# Patient Record
Sex: Male | Born: 1975 | Race: White | Hispanic: No | Marital: Married | Smoking: Former smoker
Health system: Southern US, Community
[De-identification: ages and names within clinical notes are randomized; demographics above are authoritative.]

## PROBLEM LIST (undated history)

## (undated) DIAGNOSIS — K219 Gastro-esophageal reflux disease without esophagitis: Secondary | ICD-10-CM

## (undated) HISTORY — PX: CHOLECYSTECTOMY: SHX55

## (undated) HISTORY — PX: WRIST SURGERY: SHX841

---

## 2009-08-05 ENCOUNTER — Ambulatory Visit: Payer: Self-pay | Admitting: Family Medicine

## 2009-08-05 DIAGNOSIS — J069 Acute upper respiratory infection, unspecified: Secondary | ICD-10-CM | POA: Insufficient documentation

## 2009-08-06 ENCOUNTER — Encounter: Payer: Self-pay | Admitting: Family Medicine

## 2010-02-25 NOTE — Letter (Signed)
Summary: Out of Work  [facility]  [address]   [place], [place] [address]   Phone: [phone]  Fax: [phone]    August 05, 2009   Employee:  [employer]    To Whom It May Concern:   For Medical reasons, please excuse the above named employee from work today and tomorrow.    If you need additional information, please feel free to contact our office.         Sincerely,    Donna Christen MD

## 2010-02-25 NOTE — Assessment & Plan Note (Signed)
Summary: Cough/runny nose- yellowish green x 4 dys rm 2   Vital Signs:  Patient Profile:   35 Years Old Male CC:      Cold & URI symptoms Height:     74 inches Weight:      210 pounds O2 Sat:      98 % O2 treatment:    Room Air Temp:     97.4 degrees F oral Pulse rate:   53 / minute Pulse rhythm:   regular Resp:     16 per minute BP sitting:   122 / 77  (right arm) Cuff size:   regular  Vitals Entered By: Areta Haber CMA (August 05, 2009 9:47 AM)                  Current Allergies: No known allergies History of Present Illness Chief Complaint: Cold & URI symptoms History of Present Illness:  Subjective: Patient complains of URI symptoms that started 4 days ago with a sore throat followed by nasal congestion Sore throat persists but has improved + cough worse at night No pleuritic pain No wheezing + post-nasal drainage ? sinus pain/pressure No itchy/red eyes No earache No hemoptysis No SOB No fever/chills No nausea No vomiting No abdominal pain No diarrhea No skin rashes + fatigue No myalgias + headache Used OTC meds without relief   Current Problems: URI (ICD-465.9)   Current Meds ACYCLOVIR 800 MG TABS (ACYCLOVIR) 1 tab by mouth once daily VICKS NYQUIL MULTI-SYMPTOM 15-6.25-325 MG CAPS (DM-DOXYLAMINE-ACETAMINOPHEN) as directed DAYQUIL MULTI-SYMPTOM 30-325-10 MG/15ML LIQD (PSEUDOEPHEDRINE-APAP-DM) as directed AFRIN NASAL SPRAY 0.05 % SOLN (OXYMETAZOLINE HCL) as directed BENZONATATE 200 MG CAPS (BENZONATATE) One by mouth hs as needed cough AZITHROMYCIN 250 MG TABS (AZITHROMYCIN) Two tabs by mouth on day 1, then 1 tab daily on days 2 through 5  (Rx void after 08/12/09)  REVIEW OF SYSTEMS Constitutional Symptoms      Denies fever, chills, night sweats, weight loss, weight gain, and fatigue.  Eyes       Denies change in vision, eye pain, eye discharge, glasses, contact lenses, and eye surgery. Ear/Nose/Throat/Mouth       Complains of frequent runny  nose, sinus problems, sore throat, and hoarseness.      Denies hearing loss/aids, change in hearing, ear pain, ear discharge, dizziness, frequent nose bleeds, and tooth pain or bleeding.      Comments: yellowish greenx 4 dys  Respiratory       Complains of productive cough.      Denies dry cough, wheezing, shortness of breath, asthma, bronchitis, and emphysema/COPD.  Cardiovascular       Denies murmurs, chest pain, and tires easily with exhertion.    Gastrointestinal       Denies stomach pain, nausea/vomiting, diarrhea, constipation, blood in bowel movements, and indigestion. Genitourniary       Denies painful urination, kidney stones, and loss of urinary control. Neurological       Complains of headaches.      Denies paralysis, seizures, and fainting/blackouts. Musculoskeletal       Denies muscle pain, joint pain, joint stiffness, decreased range of motion, redness, swelling, muscle weakness, and gout.  Skin       Denies bruising, unusual mles/lumps or sores, and hair/skin or nail changes.  Psych       Denies mood changes, temper/anger issues, anxiety/stress, speech problems, depression, and sleep problems. Other Comments: yellowish green x 4 dys. Pt has not seen PCP for this.   Past History:  Past Medical History: HSV 1  Past Surgical History: Denies surgical history  Social History: Married Never Smoked Alcohol use-no Drug use-no Regular exercise-yes Smoking Status:  never Drug Use:  no Does Patient Exercise:  yes   Objective:  Appearance:  Patient appears healthy, stated age, and in no acute distress  Eyes:  Pupils are equal, round, and reactive to light and accomdation.  Extraocular movement is intact.  Conjunctivae are not inflamed.  Ears:  Canals normal.  Tympanic membranes normal.   Nose:  Normal septum.  Normal turbinates, mildly congested.    No sinus tenderness present.  Pharynx:  Mildly erythematous Neck:  Supple.  No adenopathy is present.  No thyromegaly is  present  Lungs:  Clear to auscultation.  Breath sounds are equal.  Heart:  Regular rate and rhythm without murmurs, rubs, or gallops.  Abdomen:  Nontender without masses or hepatosplenomegaly.  Bowel sounds are present.  No CVA or flank tenderness.  Rapid strep test negative  Assessment New Problems: URI (ICD-465.9)  VIRAL URI  Plan New Medications/Changes: AZITHROMYCIN 250 MG TABS (AZITHROMYCIN) Two tabs by mouth on day 1, then 1 tab daily on days 2 through 5  (Rx void after 08/12/09)  #6 tabs x 0, 08/05/2009, Donna Christen MD BENZONATATE 200 MG CAPS (BENZONATATE) One by mouth hs as needed cough  #12 x 0, 08/05/2009, Donna Christen MD  New Orders: New Patient Level III [[address]] Rapid Strep [87880] T-Culture, Throat [[id number]] Planning Comments:   Treat symptomatically for now:  expectorant/decongestant, cough suppressant at bedtime.  Add Z-pack if not improving about 5 days. Throat culture pending. Follow-up with PCP if not improving.   The patient and/or caregiver has been counseled thoroughly with regard to medications prescribed including dosage, schedule, interactions, rationale for use, and possible side effects and they verbalize understanding.  Diagnoses and expected course of recovery discussed and will return if not improved as expected or if the condition worsens. Patient and/or caregiver verbalized understanding.  Prescriptions: AZITHROMYCIN 250 MG TABS (AZITHROMYCIN) Two tabs by mouth on day 1, then 1 tab daily on days 2 through 5  (Rx void after 08/12/09)  #6 tabs x 0   Entered and Authorized by:   Donna Christen MD   Signed by:   Donna Christen MD on 08/05/2009   Method used:   Print then Give to Patient   RxID:   [id number] BENZONATATE 200 MG CAPS (BENZONATATE) One by mouth hs as needed cough  #12 x 0   Entered and Authorized by:   Donna Christen MD   Signed by:   Donna Christen MD on 08/05/2009   Method used:   Print then Give to Patient   RxID:    [id number]   Patient Instructions: 1)  May use Mucinex D (guaifenesin with decongestant) twice daily for congestion. 2)  Increase fluid intake, rest. 3)  May use Afrin nasal spray (or generic oxymetazoline) twice daily for about 5 days.  Also recommend using saline nasal spray several times daily and/or saline nasal irrigation. 4)  Add Z-pack if not improving 5 to 7 days. 5)  Followup with family doctor if not improving 7 to 10 days  Orders Added: 1)  New Patient Level III [[address]] 2)  Rapid Strep [41324] 3)  T-Culture, Throat [[id number]]  Laboratory Results  Date/Time Received: August 05, 2009 10:27 AM  Date/Time Reported: August 05, 2009 10:27 AM   Other Tests  Rapid Strep: negative  Kit Test Internal QC: Negative   (Normal  Range: Negative)

## 2010-03-02 ENCOUNTER — Emergency Department (HOSPITAL_BASED_OUTPATIENT_CLINIC_OR_DEPARTMENT_OTHER)
Admission: EM | Admit: 2010-03-02 | Discharge: 2010-03-02 | Disposition: A | Discharge status: Home / Self Care | Payer: BC Managed Care – HMO | Attending: Emergency Medicine | Admitting: Emergency Medicine

## 2010-03-02 DIAGNOSIS — J029 Acute pharyngitis, unspecified: Secondary | ICD-10-CM | POA: Insufficient documentation

## 2010-07-11 ENCOUNTER — Inpatient Hospital Stay (INDEPENDENT_AMBULATORY_CARE_PROVIDER_SITE_OTHER)
Admission: RE | Admit: 2010-07-11 | Discharge: 2010-07-11 | Disposition: A | Discharge status: Home / Self Care | Payer: BC Managed Care – HMO | Source: Ambulatory Visit | Attending: Family Medicine | Admitting: Family Medicine

## 2010-07-11 ENCOUNTER — Encounter: Payer: Self-pay | Admitting: Family Medicine

## 2010-07-11 DIAGNOSIS — H6690 Otitis media, unspecified, unspecified ear: Secondary | ICD-10-CM

## 2010-07-11 DIAGNOSIS — H6120 Impacted cerumen, unspecified ear: Secondary | ICD-10-CM

## 2010-12-29 NOTE — Progress Notes (Signed)
Summary: SINUS & EAR PAIN (rm 5)   Vital Signs:  Patient Profile:   35 Years Old Male CC:      left ear pain and congestion Height:     74 inches Weight:      205 pounds O2 Sat:      97 % O2 treatment:    Room Air Temp:     98.7 degrees F oral Pulse rate:   88 / minute Resp:     14 per minute BP sitting:   130 / 80  (left arm) Cuff size:   large  Vitals Entered By: Lajean Saver RN (July 11, 2010 7:36 PM)                  Updated Prior Medication List: No Medications Current Allergies: No known allergies History of Present Illness Chief Complaint: left ear pain and congestion History of Present Illness:  Subjective:  Patient complains of 2 week history of sinus congestion, with initial cold-like symptoms resolved.  Three days ago he developed sensation of right ear clogged without pain.  Has persistent sinus congestion.  No fevers, chills, and sweats.  No cough at present.  No drainage from ears  REVIEW OF SYSTEMS Constitutional Symptoms      Denies fever, chills, night sweats, weight loss, weight gain, and fatigue.  Eyes       Denies change in vision, eye pain, eye discharge, glasses, contact lenses, and eye surgery. Ear/Nose/Throat/Mouth       Complains of ear pain, frequent runny nose, and sinus problems.      Denies hearing loss/aids, change in hearing, ear discharge, dizziness, frequent nose bleeds, sore throat, hoarseness, and tooth pain or bleeding.      Comments: congestion Respiratory       Denies dry cough, productive cough, wheezing, shortness of breath, asthma, bronchitis, and emphysema/COPD.  Cardiovascular       Denies murmurs, chest pain, and tires easily with exhertion.    Gastrointestinal       Denies stomach pain, nausea/vomiting, diarrhea, constipation, blood in bowel movements, and indigestion. Genitourniary       Denies painful urination, blood or discharge from penis, kidney stones, and loss of urinary control. Neurological       Denies  paralysis, seizures, and fainting/blackouts. Musculoskeletal       Denies muscle pain, joint pain, joint stiffness, decreased range of motion, redness, swelling, muscle weakness, and gout.  Skin       Denies bruising, unusual mles/lumps or sores, and hair/skin or nail changes.  Psych       Denies mood changes, temper/anger issues, anxiety/stress, speech problems, depression, and sleep problems. Other Comments: Congestion x 2 weeks. Left ear pain x 2-3 days. Taken Mucinex.    Past History:  Past Medical History: Reviewed history from 08/05/2009 and no changes required. HSV 1  Past Surgical History: Reviewed history from 08/05/2009 and no changes required. Denies surgical history  Social History: Married Never Smoked Alcohol use-no Drug use-no Regular exercise-yes Occupation: [employer]   Objective:  Appearance:  Patient appears healthy, stated age, and in no acute distress  Eyes:  Pupils are equal, round, and reactive to light and accomodation.  Extraocular movement is intact.  Conjunctivae are not inflamed.  Ears:  Both canals almost completely occluded with cerumen.  Left tympanic membrane is barely visible but has no erythema.  Right tympanic membrane is barely visible but is erythematous. Nose:  Mildly congested turbinates.  No sinus tenderness  Pharynx:  Normal  Neck:  Supple.  No adenopathy is present.  Lungs:  Clear to auscultation.  Breath sounds are equal.  Heart:  Regular rate and rhythm without murmurs, rubs, or gallops.  Assessment New Problems: CERUMEN IMPACTION, BILATERAL (ICD-380.4) OTITIS MEDIA, ACUTE, RIGHT (ICD-382.9)   Plan New Medications/Changes: DEBROX 6.5 % SOLN (CARBAMIDE PEROXIDE) Place 5 to 10 gtts in affected ear two times a day (not more than 4 days)  #1 bottle x 0, 07/11/2010, Donna Christen MD AMOXICILLIN 875 MG TABS (AMOXICILLIN) One by mouth two times a day  #20 x 0, 07/11/2010, Donna Christen MD  New Orders: Est. Patient Level III  [phone] Services provided After hours-Weekends-Holidays [[address]] Planning Comments:   Begin amoxicillin.  Continue Mucinex D and increased fluids.  Begin Debrox drops for 4 days, then return 4 to 5 days for ear lavage.  May need ENT referral for cerumen removal.   The patient and/or caregiver has been counseled thoroughly with regard to medications prescribed including dosage, schedule, interactions, rationale for use, and possible side effects and they verbalize understanding.  Diagnoses and expected course of recovery discussed and will return if not improved as expected or if the condition worsens. Patient and/or caregiver verbalized understanding.  Prescriptions: DEBROX 6.5 % SOLN (CARBAMIDE PEROXIDE) Place 5 to 10 gtts in affected ear two times a day (not more than 4 days)  #1 bottle x 0   Entered and Authorized by:   Donna Christen MD   Signed by:   Donna Christen MD on 07/11/2010   Method used:   Print then Give to Patient   RxID:   [id number] AMOXICILLIN 875 MG TABS (AMOXICILLIN) One by mouth two times a day  #20 x 0   Entered and Authorized by:   Donna Christen MD   Signed by:   Donna Christen MD on 07/11/2010   Method used:   Print then Give to Patient   RxID:   [id number]   Patient Instructions: 1)  Take Mucinex D (guaifenesin with decongestant) twice daily for congestion. 2)  Increase fluid intake  3)  May use Afrin nasal spray (or generic oxymetazoline) twice daily for about 5 days.  Also recommend using saline nasal spray several times daily and/or saline nasal irrigation. 4)  Return about 4 to 5 days for ear lavage 5)     Orders Added: 1)  Est. Patient Level III [29528] 2)  Services provided After hours-Weekends-Holidays [[address]]

## 2015-05-20 ENCOUNTER — Emergency Department (HOSPITAL_BASED_OUTPATIENT_CLINIC_OR_DEPARTMENT_OTHER)
Admission: EM | Admit: 2015-05-20 | Discharge: 2015-05-20 | Disposition: A | Discharge status: Home / Self Care | Payer: BLUE CROSS/BLUE SHIELD | Attending: Emergency Medicine | Admitting: Emergency Medicine

## 2015-05-20 ENCOUNTER — Emergency Department (HOSPITAL_BASED_OUTPATIENT_CLINIC_OR_DEPARTMENT_OTHER): Payer: BLUE CROSS/BLUE SHIELD

## 2015-05-20 ENCOUNTER — Encounter (HOSPITAL_BASED_OUTPATIENT_CLINIC_OR_DEPARTMENT_OTHER): Payer: Self-pay | Admitting: *Deleted

## 2015-05-20 ENCOUNTER — Other Ambulatory Visit: Payer: Self-pay

## 2015-05-20 DIAGNOSIS — K8010 Calculus of gallbladder with chronic cholecystitis without obstruction: Secondary | ICD-10-CM | POA: Insufficient documentation

## 2015-05-20 DIAGNOSIS — K219 Gastro-esophageal reflux disease without esophagitis: Secondary | ICD-10-CM | POA: Insufficient documentation

## 2015-05-20 DIAGNOSIS — Z87891 Personal history of nicotine dependence: Secondary | ICD-10-CM | POA: Diagnosis not present

## 2015-05-20 DIAGNOSIS — R1013 Epigastric pain: Secondary | ICD-10-CM

## 2015-05-20 DIAGNOSIS — R109 Unspecified abdominal pain: Secondary | ICD-10-CM | POA: Diagnosis present

## 2015-05-20 HISTORY — DX: Gastro-esophageal reflux disease without esophagitis: K21.9

## 2015-05-20 LAB — COMPREHENSIVE METABOLIC PANEL
ALK PHOS: 83 U/L (ref 38–126)
ALT: 31 U/L (ref 17–63)
AST: 24 U/L (ref 15–41)
Albumin: 4.5 g/dL (ref 3.5–5.0)
Anion gap: 13 (ref 5–15)
BILIRUBIN TOTAL: 0.8 mg/dL (ref 0.3–1.2)
BUN: 16 mg/dL (ref 6–20)
CALCIUM: 9.4 mg/dL (ref 8.9–10.3)
CO2: 23 mmol/L (ref 22–32)
CREATININE: 0.98 mg/dL (ref 0.61–1.24)
Chloride: 102 mmol/L (ref 101–111)
GFR calc Af Amer: >60 mL/min (ref >60)
GFR calc non Af Amer: >60 mL/min (ref >60)
GLUCOSE: 91 mg/dL (ref 65–99)
Potassium: 3.8 mmol/L (ref 3.5–5.1)
SODIUM: 138 mmol/L (ref 135–145)
TOTAL PROTEIN: 8.2 g/dL — AB (ref 6.5–8.1)

## 2015-05-20 LAB — TROPONIN I

## 2015-05-20 LAB — URINALYSIS, ROUTINE W REFLEX MICROSCOPIC
BILIRUBIN URINE: NEGATIVE
Glucose, UA: NEGATIVE mg/dL
HGB URINE DIPSTICK: NEGATIVE
Ketones, ur: NEGATIVE mg/dL
Leukocytes, UA: NEGATIVE
NITRITE: NEGATIVE
Protein, ur: NEGATIVE mg/dL
SPECIFIC GRAVITY, URINE: 1.014 (ref 1.005–1.030)
pH: 6 (ref 5.0–8.0)

## 2015-05-20 LAB — CBC
HCT: 45.2 % (ref 39.0–52.0)
HEMOGLOBIN: 16 g/dL (ref 13.0–17.0)
MCH: 30.6 pg (ref 26.0–34.0)
MCHC: 35.4 g/dL (ref 30.0–36.0)
MCV: 86.4 fL (ref 78.0–100.0)
Platelets: 380 10*3/uL (ref 150–400)
RBC: 5.23 MIL/uL (ref 4.22–5.81)
RDW: 11.9 % (ref 11.5–15.5)
WBC: 8.9 10*3/uL (ref 4.0–10.5)

## 2015-05-20 LAB — LIPASE, BLOOD: LIPASE: 29 U/L (ref 11–51)

## 2015-05-20 NOTE — ED Notes (Signed)
Patient transported to Ultrasound 

## 2015-05-20 NOTE — ED Provider Notes (Signed)
Patient awaiting completion of ultrasound.  If gall stones are present, surgical referral. Otherwise, add carafate to current treatment regimen for GERD.  5:32 PM Ultrasound results reviewed and shared with patient. Findings consistent with cholecystitis.  Patient remains pain-free in ED.  Labs reassuring: no leukocytosis, normal LFT's.  Referral to surgery.    Felicie Mornavid Kimberlee Shoun, NP 05/20/15 1753  Loren Raceravid Yelverton, MD 05/27/15 [phone]

## 2015-05-20 NOTE — ED Provider Notes (Signed)
CSN: [id number]     Arrival date & time 05/20/15  1328 History   First MD Initiated Contact with Patient 05/20/15 1430     Chief Complaint  Patient presents with  . Abdominal Pain     (Consider location/radiation/quality/duration/timing/severity/associated sxs/prior Treatment) HPI Keith Oliver is a 40 y.o. male with history of acid reflux, presents to emergency department complaining of upper abdominal pain. Patient states that he has had on and off pain for the last 6 months. He states pain would be present for up to a week and then subsides for several days to several weeks. He has had a cardiac workup which included EKG and blood work and was told it was unremarkable. He was seen by GI and had an endoscopy done 2 months ago and current spell, which he was told that he has acid reflux but it was otherwise normal. He is currently taking Nexium daily. He states that his pain is not improving.  Past Medical History  Diagnosis Date  . GERD (gastroesophageal reflux disease)    Past Surgical History  Procedure Laterality Date  . Wrist surgery     No family history on file. Social History  Substance Use Topics  . Smoking status: Former Games developer  . Smokeless tobacco: None  . Alcohol Use: No    Review of Systems  Constitutional: Negative for fever and chills.  Respiratory: Positive for chest tightness. Negative for cough and shortness of breath.   Cardiovascular: Positive for chest pain. Negative for palpitations and leg swelling.  Gastrointestinal: Positive for nausea, vomiting and abdominal pain. Negative for diarrhea and abdominal distention.  Genitourinary: Negative for dysuria, urgency, frequency and hematuria.  Musculoskeletal: Negative for myalgias, arthralgias, neck pain and neck stiffness.  Skin: Negative for rash.  Allergic/Immunologic: Negative for immunocompromised state.  Neurological: Negative for dizziness, weakness, light-headedness, numbness and headaches.  All other  systems reviewed and are negative.     Allergies  Review of patient's allergies indicates no known allergies.  Home Medications   Prior to Admission medications   Medication Sig Start Date End Date Taking? Authorizing Provider  Esomeprazole Magnesium (NEXIUM PO) Take by mouth.   Yes Historical Provider, MD   BP 140/100 mmHg  Pulse 67  Temp(Src) 97.8 F (36.6 C) (Oral)  Resp 20  Ht 6' (1.829 m)  Wt 92.987 kg  BMI 27.80 kg/m2  SpO2 99% Physical Exam  Constitutional: He is oriented to person, place, and time. He appears well-developed and well-nourished. No distress.  HENT:  Head: Normocephalic and atraumatic.  Eyes: Conjunctivae are normal.  Neck: Neck supple.  Cardiovascular: Normal rate, regular rhythm and normal heart sounds.   Pulmonary/Chest: Effort normal and breath sounds normal. No respiratory distress. He has no wheezes. He has no rales.  Abdominal: Soft. Bowel sounds are normal. He exhibits no distension. There is tenderness. There is no rebound.  RUQ, epigastric tenderness, RUQ tenderness  Musculoskeletal: He exhibits no edema.  Neurological: He is alert and oriented to person, place, and time.  Skin: Skin is warm and dry.  Nursing note and vitals reviewed.   ED Course  Procedures (including critical care time) Labs Review Labs Reviewed  COMPREHENSIVE METABOLIC PANEL - Abnormal; Notable for the following:    Total Protein 8.2 (*)    All other components within normal limits  LIPASE, BLOOD  CBC  URINALYSIS, ROUTINE W REFLEX MICROSCOPIC (NOT AT [facility])  TROPONIN I    Imaging Review No results found. I have personally reviewed and evaluated  these images and lab results as part of my medical decision-making.   EKG Interpretation None      MDM   Final diagnoses:  None   Pt with intermittent upper abdominal pain for 6 months. Recent endoscopy which was unremarkable? Currently treaded for GERD. Will get labs, ua. Patient states pain is not severe at  this time.  Labs all unremarkable. Patient did have some tenderness right upper quadrant, ultrasound ordered to rule out biliary colic.   5:10 PM Patient reassessed. He is pain-free. Appears to be comfortable. Pending ultrasound results. Discussed plan with the patient. If ultrasound is negative, add Carafate, follow-up with his gastroenterologist. Patient signed out at shift change  Filed Vitals:   05/20/15 1336 05/20/15 1543  BP: 140/100 120/79  Pulse: 67 89  Temp: 97.8 F (36.6 C)   TempSrc: Oral   Resp: 20 18  Height: 6' (1.829 m)   Weight: 92.987 kg   SpO2: 99% 100%     Jaynie Crumbleatyana Roxann Vierra, PA-C 05/20/15 1712  Gwyneth SproutWhitney Plunkett, MD 05/21/15 [phone]

## 2015-05-20 NOTE — Discharge Instructions (Signed)

## 2015-05-20 NOTE — ED Notes (Signed)
Up all night with pressure in the center of his chest. States he has had this since December. He has a negative cardiac work up in December and a negative endoscopy.

## 2015-05-29 ENCOUNTER — Ambulatory Visit: Payer: Self-pay | Admitting: General Surgery

## 2015-06-11 ENCOUNTER — Other Ambulatory Visit: Payer: Self-pay | Admitting: General Surgery

## 2017-04-21 ENCOUNTER — Other Ambulatory Visit: Payer: Self-pay

## 2017-04-21 ENCOUNTER — Emergency Department (HOSPITAL_BASED_OUTPATIENT_CLINIC_OR_DEPARTMENT_OTHER)
Admission: EM | Admit: 2017-04-21 | Discharge: 2017-04-21 | Disposition: A | Discharge status: Home / Self Care | Payer: BLUE CROSS/BLUE SHIELD | Attending: Emergency Medicine | Admitting: Emergency Medicine

## 2017-04-21 ENCOUNTER — Encounter (HOSPITAL_BASED_OUTPATIENT_CLINIC_OR_DEPARTMENT_OTHER): Payer: Self-pay

## 2017-04-21 DIAGNOSIS — Z87891 Personal history of nicotine dependence: Secondary | ICD-10-CM | POA: Insufficient documentation

## 2017-04-21 DIAGNOSIS — M10072 Idiopathic gout, left ankle and foot: Secondary | ICD-10-CM | POA: Insufficient documentation

## 2017-04-21 DIAGNOSIS — M79675 Pain in left toe(s): Secondary | ICD-10-CM | POA: Diagnosis present

## 2017-04-21 MED ORDER — HYDROCODONE-ACETAMINOPHEN 5-325 MG PO TABS: 1.0000 | ORAL_TABLET | ORAL | 0 refills | Status: DC | PRN

## 2017-04-21 MED ORDER — IBUPROFEN 400 MG PO TABS
400.0000 mg | ORAL_TABLET | Freq: Once | ORAL | Status: AC | PRN
  Administered 2017-04-21: 400 mg via ORAL
  Filled 2017-04-21: qty 1

## 2017-04-21 MED ORDER — COLCHICINE 0.6 MG PO TABS: ORAL_TABLET | ORAL | 0 refills | Status: DC

## 2017-04-21 NOTE — ED Triage Notes (Signed)
C/o left great toe pain, redness, swelling x 3 days-denies injury-NAD-steady gait

## 2017-04-21 NOTE — ED Provider Notes (Addendum)
[facility] Provider Note: Lowella DellJ. Lane Narelle Schoening, MD, FACEP  CSN: [id number] MRN: [id number] ARRIVAL: 04/21/17 at 1940 ROOM: MHFT1/MHFT1   CHIEF COMPLAINT  Toe Pain   HISTORY OF PRESENT ILLNESS  04/21/17 11:00 PM Keith CrockRobert Rappaport is a 42 y.o. male with a family history of gout.  He is here with pain in his left great toe, at the interphalangeal joint, for the past 3 days.  He denies trauma or other precipitating event.  There is associated erythema.  He rates the pain as severe, worse with attempted movement or weightbearing.  He is been taking ibuprofen without adequate relief.  He has no personal history of gout.  He has had no fever or systemic symptoms.  [place] [facility] [place] reveals the patient has received no opioid prescriptions in the past year.   Past Medical History:  Diagnosis Date  . GERD (gastroesophageal reflux disease)     Past Surgical History:  Procedure Laterality Date  . CHOLECYSTECTOMY    . WRIST SURGERY      No family history on file.  Social History   Tobacco Use  . Smoking status: Former Games developermoker  . Smokeless tobacco: Never Used  Substance Use Topics  . Alcohol use: Yes    Comment: occ  . Drug use: No    Prior to Admission medications   Medication Sig Start Date End Date Taking? Authorizing Provider  Esomeprazole Magnesium (NEXIUM PO) Take by mouth.    [provider]    Allergies Patient has no known allergies.   REVIEW OF SYSTEMS  Negative except as noted here or in the History of Present Illness.   PHYSICAL EXAMINATION  Initial Vital Signs Blood pressure 134/82, pulse 64, temperature 98.9 F (37.2 C), temperature source Oral, resp. rate 18, height 6\' 3"  (1.905 m), weight 100.3 kg (221 lb 1.9 oz), SpO2 100 %.  Examination General: Well-developed, well-nourished male in no acute distress; appearance consistent with age of record HENT: normocephalic; atraumatic Eyes:  Normal appearance Neck: supple Heart: regular rate and rhythm Lungs: clear to auscultation bilaterally Abdomen: soft; nondistended; nontender; bowel sounds present Extremities: No deformity; pulses normal; erythema and swelling of left great toe without significant warmth, range of motion at IP joint is limited:  Neurologic: Awake, alert and oriented; motor function intact in all extremities and symmetric; no facial droop Skin: Warm and dry Psychiatric: Normal mood and affect   RESULTS  Summary of this visit's results, reviewed by myself:   EKG Interpretation  Date/Time:    Ventricular Rate:    PR Interval:    QRS Duration:   QT Interval:    QTC Calculation:   R Axis:     Text Interpretation:        Laboratory Studies: No results found for this or any previous visit (from the past 24 hour(s)). Imaging Studies: No results found.  ED COURSE  Nursing notes and initial vitals signs, including pulse oximetry, reviewed.  Vitals:   04/21/17 1949 04/21/17 1950  BP: 134/82   Pulse: 64   Resp: 18   Temp: 98.9 F (37.2 C)   TempSrc: Oral   SpO2: 100%   Weight:  100.3 kg (221 lb 1.9 oz)  Height:  6\' 3"  (1.905 m)   History and exam consistent with gout.  It is in an atypical location as it usually occurs at the MTP joint.  There is no significant warmth even though there is significant erythema.  This is  consistent with gout.  I have a low suspicion for septic joint.  PROCEDURES    ED DIAGNOSES     ICD-10-CM   1. Acute idiopathic gout involving toe of left foot M10.072        Keith Oliver, Keith Ruiz, MD 04/21/17 2311    Paula Libra, MD 04/21/17 2312

## 2018-01-26 IMAGING — US US ABDOMEN COMPLETE
1 series · 13 of 25 positions shown · non-contrast
Comparison: Chest CT 10/22/2014.

CLINICAL DATA: Intermittent epigastric and right upper quadrant
abdominal pain for 5 months.

EXAM:
ABDOMEN ULTRASOUND COMPLETE

[Series 1: us abdomen complete · 0.18mm/px · 13 of 80 slices shown]
[im 1/80]
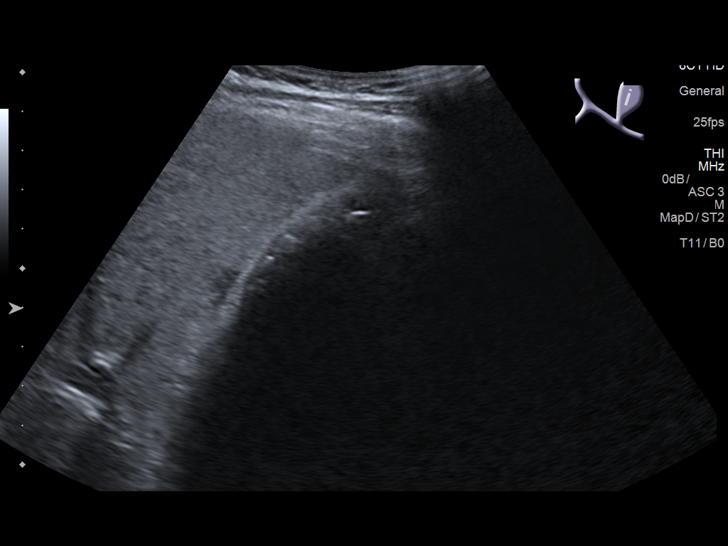
[im 7/80]
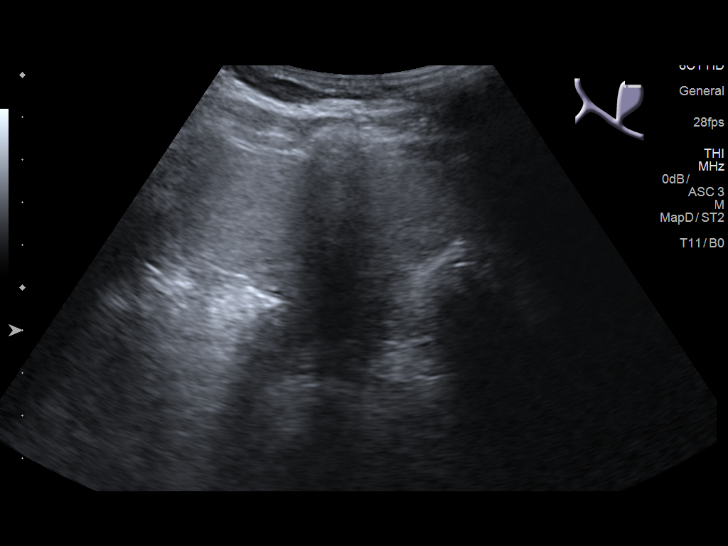
[im 14/80]
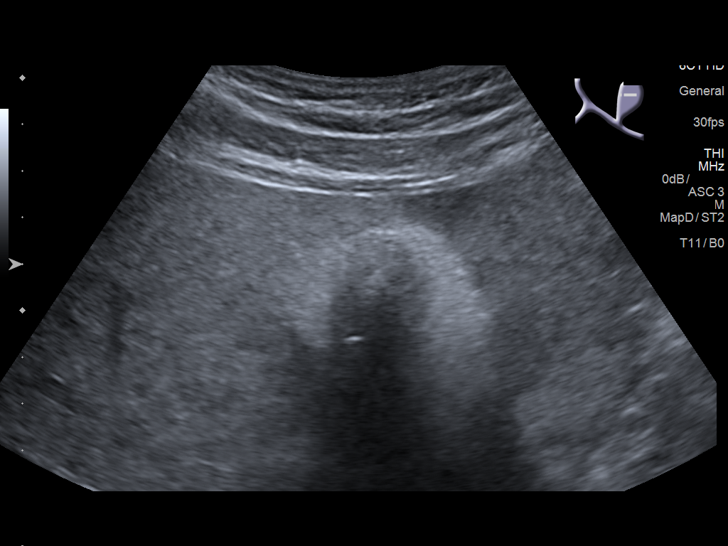
[im 20/80]
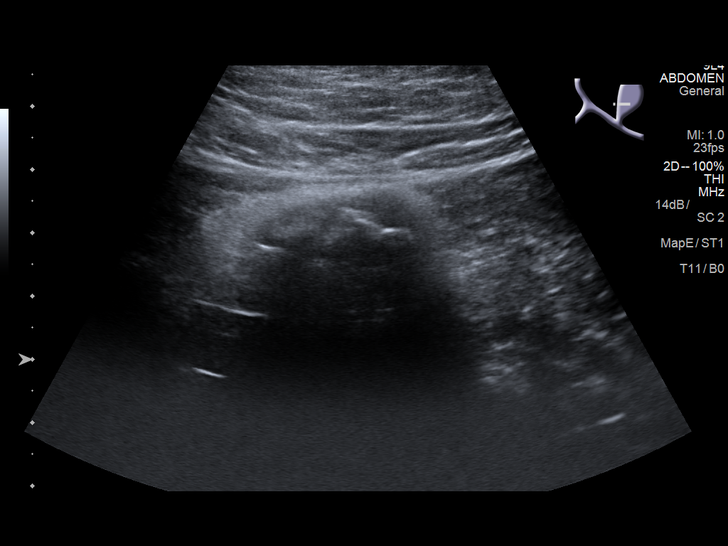
[im 27/80]
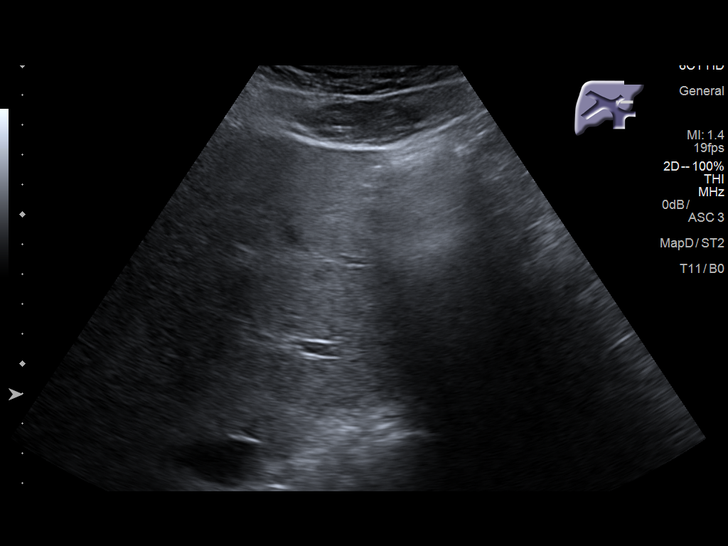
[im 33/80]
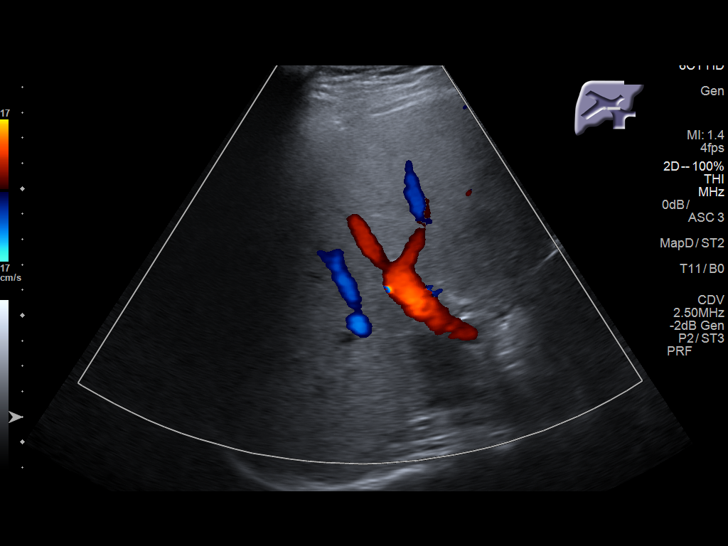
[im 40/80]
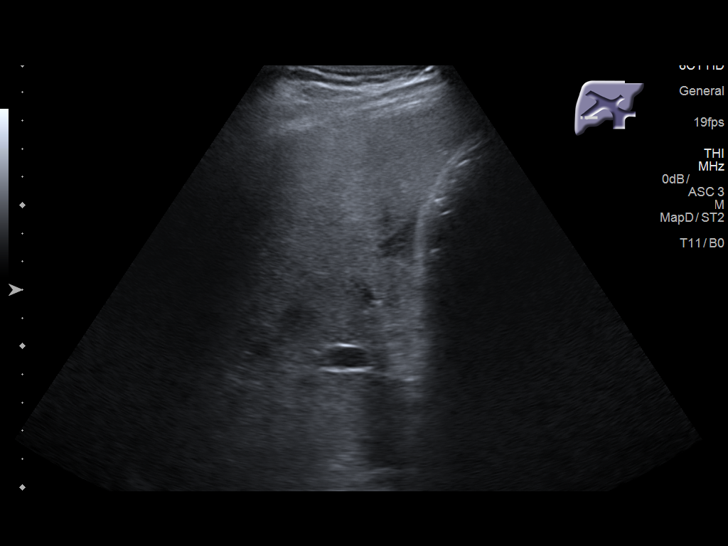
[im 47/80]
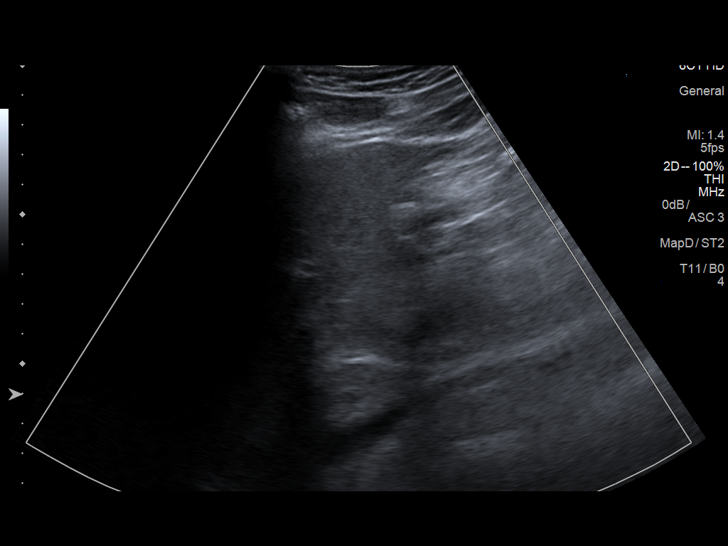
[im 53/80]
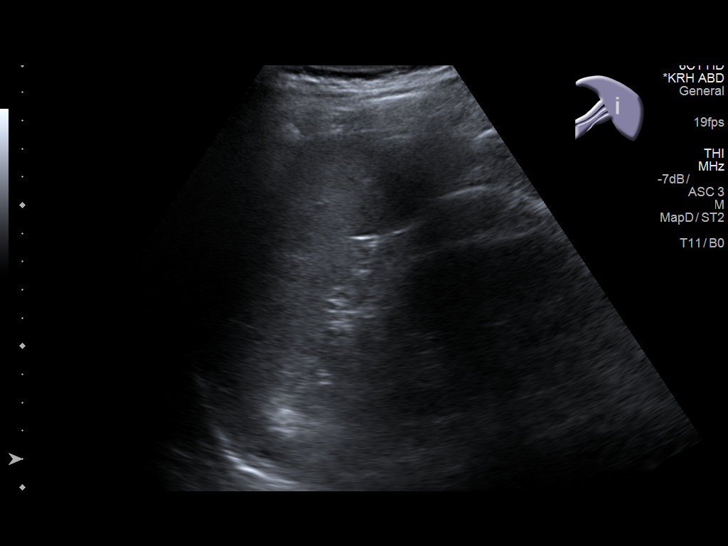
[im 60/80]
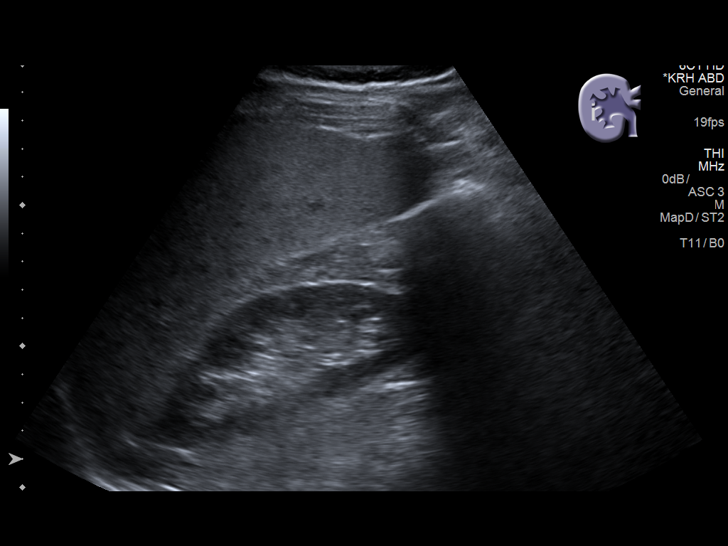
[im 66/80]
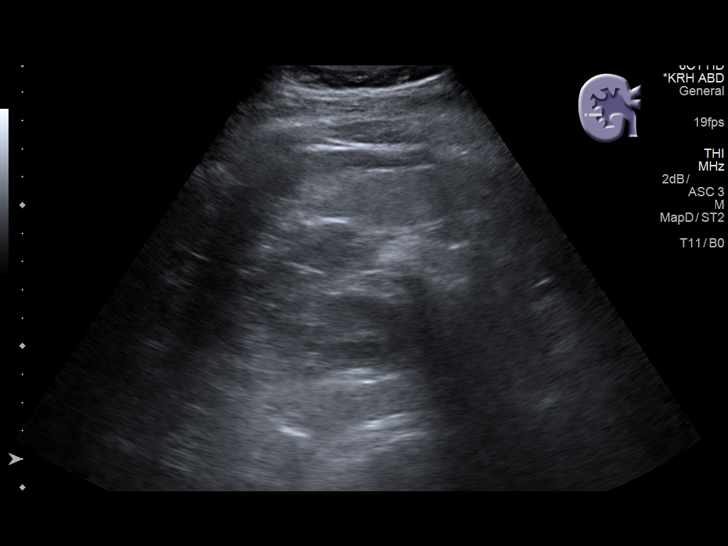
[im 73/80]
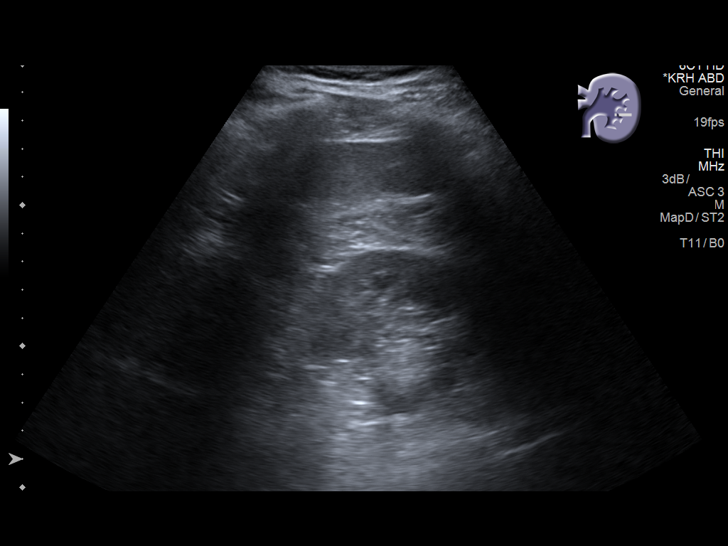
[im 80/80]
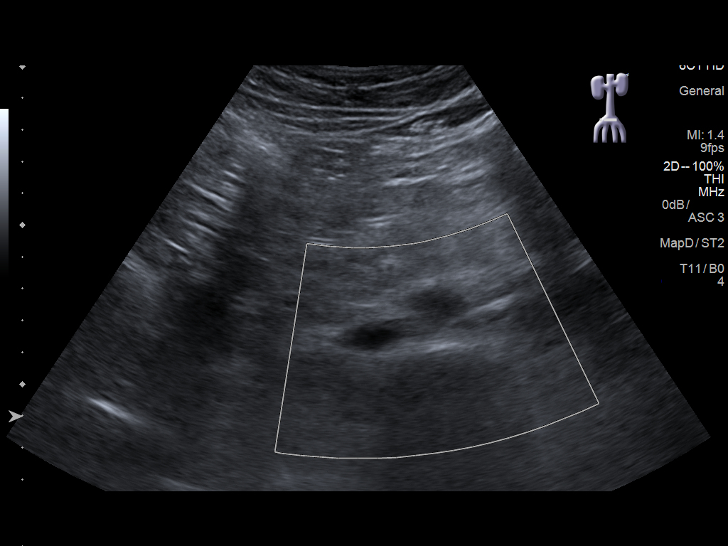

[13 of 25 positions shown; findings below may reference images not displayed]

FINDINGS: Gallbladder: Echogenic, shadowing structure in the region of the
gallbladder fossa is noted, most consistent with a wall echo shadow
sign. The gallbladder appears to be filled with stones. There is
mild gallbladder wall thickening with pericholecystic fluid and a
positive sonographic Murphy's sign.

Common bile duct: Diameter: 1.8 mm.

Liver: Increased hepatic echogenicity corresponding with steatosis
on CT. There is focal sparing adjacent to the gallbladder.

IVC: No abnormality visualized.

Pancreas: The visualized portions appear unremarkable. Portions of
the pancreatic tail are obscured by bowel gas.

Spleen: Size and appearance within normal limits.

Right Kidney: Length: 10.4 cm. Echogenicity within normal limits. No
mass or hydronephrosis visualized.

Left Kidney: Length: 11.9 cm. Echogenicity within normal limits. No
mass or hydronephrosis visualized.

Abdominal aorta: No aneurysm visualized.

Other findings: No ascites or other significant findings.
IMPRESSION: 1. Abnormal appearance of the gallbladder most consistent with a
wall echo shadow sign indicative of a gallbladder filled with
shadowing stones. There is associated wall thickening and a
sonographic Murphy's sign suspicious for acute cholecystitis.
2. No evidence of biliary dilatation.
3. Hepatic steatosis.

## 2018-05-15 ENCOUNTER — Emergency Department (INDEPENDENT_AMBULATORY_CARE_PROVIDER_SITE_OTHER)
Admission: EM | Admit: 2018-05-15 | Discharge: 2018-05-15 | Disposition: A | Discharge status: Home / Self Care | Payer: BLUE CROSS/BLUE SHIELD | Source: Home / Self Care

## 2018-05-15 ENCOUNTER — Encounter: Payer: Self-pay | Admitting: *Deleted

## 2018-05-15 ENCOUNTER — Other Ambulatory Visit: Payer: Self-pay

## 2018-05-15 DIAGNOSIS — Z23 Encounter for immunization: Secondary | ICD-10-CM

## 2018-05-15 DIAGNOSIS — T1592XA Foreign body on external eye, part unspecified, left eye, initial encounter: Secondary | ICD-10-CM

## 2018-05-15 MED ORDER — TETANUS-DIPHTH-ACELL PERTUSSIS 5-2.5-18.5 LF-MCG/0.5 IM SUSP
0.5000 mL | Freq: Once | INTRAMUSCULAR | Status: AC
Start: 2018-05-15 — End: 2018-05-15
  Administered 2018-05-15: 0.5 mL via INTRAMUSCULAR

## 2018-05-15 NOTE — ED Triage Notes (Signed)
Patient reports that after doing some cleaning yesterday, felt an irritation in his left eye. His sister reports she saw a hair and attempted to wash out the left eye. This AM it is still draining and irritated.

## 2018-05-15 NOTE — ED Provider Notes (Signed)
[facility]    CSN: [id number] Arrival date & time: 05/15/18  1104     History   Chief Complaint Chief Complaint  Patient presents with  . Eye Problem    left    HPI Keith Oliver is a 43 y.o. male.   HPI Keith Oliver is a 43 y.o. male presenting to UC with c/o Left eye pain and irritation since yesterday after cleaning.  His sister reports seeing a hair in his eye. They attempted to flush his eye several times yesterday w/o relief.  Pt's wife encouraged him to come be evaluated for a possible abrasion.  He reports mild photophobia but no change in vision. He never wears contacts or glasses.  He does not recall his last tetanus.   Past Medical History:  Diagnosis Date  . GERD (gastroesophageal reflux disease)     Patient Active Problem List   Diagnosis Date Noted  . URI 08/05/2009    Past Surgical History:  Procedure Laterality Date  . CHOLECYSTECTOMY    . WRIST SURGERY         Home Medications    Prior to Admission medications   Medication Sig Start Date End Date Taking? Authorizing Provider  Omeprazole (PRILOSEC PO) Take by mouth.   Yes [provider]    Family History History reviewed. No pertinent family history.  Social History Social History   Tobacco Use  . Smoking status: Former Games developer  . Smokeless tobacco: Never Used  Substance Use Topics  . Alcohol use: Yes    Comment: occ  . Drug use: No     Allergies   Patient has no known allergies.   Review of Systems Review of Systems  Constitutional: Negative for chills and fever.  HENT: Negative for congestion, ear pain and rhinorrhea.   Eyes: Positive for photophobia, pain, discharge (watery), redness and itching. Negative for visual disturbance.       Left  Neurological: Negative for dizziness and headaches.     Physical Exam Triage Vital Signs ED Triage Vitals  Enc Vitals Group     BP 05/15/18 1121 116/79     Pulse Rate 05/15/18 1121 69     Resp 05/15/18 1121  16     Temp 05/15/18 1121 98.3 F (36.8 C)     Temp Source 05/15/18 1121 Oral     SpO2 05/15/18 1121 98 %     Weight 05/15/18 1122 220 lb (99.8 kg)     Height 05/15/18 1122 6\' 3"  (1.905 m)     Head Circumference --      Peak Flow --      Pain Score 05/15/18 1121 0     Pain Loc --      Pain Edu? --      Excl. in GC? --    No data found.  Updated Vital Signs BP 116/79 (BP Location: Right Arm)   Pulse 69   Temp 98.3 F (36.8 C) (Oral)   Resp 16   Ht 6\' 3"  (1.905 m)   Wt 220 lb (99.8 kg)   SpO2 98%   BMI 27.50 kg/m   Visual Acuity Right Eye Distance: 20/25 Left Eye Distance: 20/25 Bilateral Distance: 20/20(uncorrected)  Right Eye Near:   Left Eye Near:    Bilateral Near:     Physical Exam Vitals signs and nursing note reviewed.  Constitutional:      Appearance: Normal appearance. He is well-developed.  HENT:     Head: Normocephalic and atraumatic.  Nose: Nose normal.  Eyes:     General: Lids are normal. Vision grossly intact.        Left eye: Foreign body present.    Extraocular Movements: Extraocular movements intact.     Conjunctiva/sclera:     Left eye: Left conjunctiva is injected.     Pupils: Pupils are equal, round, and reactive to light.      Comments: Left eye: foreign body concerning for metal speck vs rust ring near center of cornea.  No drainage from eye.   Neck:     Musculoskeletal: Normal range of motion.  Cardiovascular:     Rate and Rhythm: Normal rate.  Pulmonary:     Effort: Pulmonary effort is normal.  Musculoskeletal: Normal range of motion.  Skin:    General: Skin is warm and dry.  Neurological:     Mental Status: He is alert and oriented to person, place, and time.  Psychiatric:        Behavior: Behavior normal.      UC Treatments / Results  Labs (all labs ordered are listed, but only abnormal results are displayed) Labs Reviewed - No data to display  EKG None  Radiology No results found.  Procedures Procedures  (including critical care time)  Medications Ordered in UC Medications  Tdap (BOOSTRIX) injection 0.5 mL (0.5 mLs Intramuscular Given 05/15/18 1151)    Initial Impression / Assessment and Plan / UC Course  I have reviewed the triage vital signs and the nursing notes.  Pertinent labs & imaging results that were available during my care of the patient were reviewed by me and considered in my medical decision making (see chart for details).     Eye flushed and attempted removal of foreign body with cotton tip applicator, flushing and a 25gtt needle (with assistance of Lajean SaverKelsey Lambert, [place]) w/o success.  Consulted with [facility] in [place] who agreed to see pt at [facility] today. Tdap update today in UC. Pt understanding and agreeable with plan to f/u at [facility] today at [facility].   Final Clinical Impressions(s) / UC Diagnoses   Final diagnoses:  Foreign body of left eye, initial encounter     Discharge Instructions      Please meet the ophthalmologist at [facility] in [place] at [facility] today.  She will get you taken care of.    Until then, you may wear sunglasses to help with the light sensitivity and rest your eyes.      ED Prescriptions    None     Controlled Substance Prescriptions [place] Controlled Substance Registry consulted? Not Applicable   Rolla Platehelps, Garima Chronis O, PA-C 05/15/18 1301

## 2018-05-15 NOTE — Discharge Instructions (Signed)
°  Please meet the ophthalmologist at [facility] in [place] at [facility] today.  She will get you taken care of.    Until then, you may wear sunglasses to help with the light sensitivity and rest your eyes.

## 2018-05-16 ENCOUNTER — Telehealth: Payer: Self-pay | Admitting: *Deleted

## 2018-05-16 NOTE — Telephone Encounter (Signed)
LM to call back to check pts status and also to find out the name of the eye doctor that he is seeing.

## 2018-05-16 NOTE — Telephone Encounter (Signed)
Pt called back and stated that he saw Dr Virginia Rochester yesterday, and follow up with her again tomorrow.

## 2018-06-25 ENCOUNTER — Encounter: Payer: Self-pay | Admitting: Emergency Medicine

## 2018-06-25 ENCOUNTER — Other Ambulatory Visit: Payer: Self-pay

## 2018-06-25 ENCOUNTER — Emergency Department
Admission: EM | Admit: 2018-06-25 | Discharge: 2018-06-25 | Disposition: A | Discharge status: Home / Self Care | Payer: BC Managed Care – PPO | Source: Home / Self Care

## 2018-06-25 DIAGNOSIS — S30861A Insect bite (nonvenomous) of abdominal wall, initial encounter: Secondary | ICD-10-CM | POA: Diagnosis not present

## 2018-06-25 DIAGNOSIS — W57XXXA Bitten or stung by nonvenomous insect and other nonvenomous arthropods, initial encounter: Secondary | ICD-10-CM | POA: Diagnosis not present

## 2018-06-25 MED ORDER — DOXYCYCLINE HYCLATE 100 MG PO CAPS: 100.0000 mg | ORAL_CAPSULE | Freq: Two times a day (BID) | ORAL | 0 refills | Status: AC

## 2018-06-25 NOTE — ED Triage Notes (Signed)
Found tick on left lower back last night; probably got it at work yesterday; not engorged and brought it to [place]. He has not travelled in past 4 weeks.

## 2018-06-25 NOTE — ED Provider Notes (Signed)
[facility]    CSN: [id number] Arrival date & time: 06/25/18  1536     History   Chief Complaint Chief Complaint  Patient presents with  . Tick Removal    HPI Keith Oliver is a 43 y.o. male.   HPI Keith Oliver is a 43 y.o. male presenting to UC with c/o a tick bite on his Left lower side/back that he just noticed yesterday. He works [employer] so he is outside a lot and unsure when the tick may have gotten onto him.  His wife was able to remove it but is concerned there is a red raised area where the tick was removed.  The tick was not engorged but family is going to the beach next week so wife encouraged him to be evaluated so he doesn't get sick while they are on vacation.    Past Medical History:  Diagnosis Date  . GERD (gastroesophageal reflux disease)     Patient Active Problem List   Diagnosis Date Noted  . URI 08/05/2009    Past Surgical History:  Procedure Laterality Date  . CHOLECYSTECTOMY    . WRIST SURGERY         Home Medications    Prior to Admission medications   Medication Sig Start Date End Date Taking? Authorizing Provider  doxycycline (VIBRAMYCIN) 100 MG capsule Take 1 capsule (100 mg total) by mouth 2 (two) times daily. One po bid x 7 days 06/25/18   Lurene ShadowPhelps, Julane Crock O, PA-C  Omeprazole (PRILOSEC PO) Take by mouth.    [provider]    Family History No family history on file.  Social History Social History   Tobacco Use  . Smoking status: Former Games developermoker  . Smokeless tobacco: Never Used  Substance Use Topics  . Alcohol use: Yes    Comment: occ  . Drug use: No     Allergies   Patient has no known allergies.   Review of Systems Review of Systems  Constitutional: Negative for chills and fever.  Musculoskeletal: Negative for arthralgias and myalgias.  Skin: Positive for rash. Negative for wound.     Physical Exam Triage Vital Signs ED Triage Vitals  Enc Vitals Group     BP      Pulse      Resp      Temp    Temp src      SpO2      Weight      Height      Head Circumference      Peak Flow      Pain Score      Pain Loc      Pain Edu?      Excl. in GC?    No data found.  Updated Vital Signs BP 121/85 (BP Location: Right Arm)   Pulse 61   Temp 98.3 F (36.8 C) (Oral)   Resp 16   Ht 6\' 3"  (1.905 m)   Wt 217 lb (98.4 kg)   BMI 27.12 kg/m   Visual Acuity Right Eye Distance:   Left Eye Distance:   Bilateral Distance:    Right Eye Near:   Left Eye Near:    Bilateral Near:     Physical Exam Vitals signs and nursing note reviewed.  Constitutional:      Appearance: He is well-developed.  HENT:     Head: Normocephalic and atraumatic.  Neck:     Musculoskeletal: Normal range of motion.  Cardiovascular:     Rate  and Rhythm: Normal rate.  Pulmonary:     Effort: Pulmonary effort is normal.  Musculoskeletal: Normal range of motion.  Skin:    General: Skin is warm and dry.     Findings: Erythema and rash present.          Comments: Left lower flank/back: 3cm area of erythematous raised macular lesion with centralized puncture wound c/w tick bite. Area is mildly tender.  Neurological:     Mental Status: He is alert and oriented to person, place, and time.  Psychiatric:        Behavior: Behavior normal.      UC Treatments / Results  Labs (all labs ordered are listed, but only abnormal results are displayed) Labs Reviewed - No data to display  EKG None  Radiology No results found.  Procedures Procedures (including critical care time)  Medications Ordered in UC Medications - No data to display  Initial Impression / Assessment and Plan / UC Course  I have reviewed the triage vital signs and the nursing notes.  Pertinent labs & imaging results that were available during my care of the patient were reviewed by me and considered in my medical decision making (see chart for details).     Tick pt brought with him appears to be a [place] Tick. Low likelihood pt  susceptible to [place] or RMSF, however, due to uncertainty of duration of bite and current red raised area, will cover for local skin infection. Encouraged to also use OTC antihistamines to help with any itching and the inflammation. Home care info provided.  Final Clinical Impressions(s) / UC Diagnoses   Final diagnoses:  Tick bite of left flank, initial encounter     Discharge Instructions      Keep area clean with warm water and mild soap.  You may try over the counter cortisone cream and antihistamines such as Benadryl, Claritin or Zyretc to help with itching and inflammation at the location of the bite.   Please take antibiotics as prescribed and be sure to complete entire course even if you start to feel better to ensure infection does not come back.  Please follow up with family medicine as needed.    ED Prescriptions    Medication Sig Dispense Auth. Provider   doxycycline (VIBRAMYCIN) 100 MG capsule Take 1 capsule (100 mg total) by mouth 2 (two) times daily. One po bid x 7 days 14 capsule Lurene Shadow, [place]     Controlled Substance Prescriptions  Controlled Substance Registry consulted? Not Applicable   Rolla Plate 06/26/18 1130

## 2018-06-25 NOTE — Discharge Instructions (Signed)
°  Keep area clean with warm water and mild soap.  You may try over the counter cortisone cream and antihistamines such as Benadryl, Claritin or Zyretc to help with itching and inflammation at the location of the bite.   Please take antibiotics as prescribed and be sure to complete entire course even if you start to feel better to ensure infection does not come back.  Please follow up with family medicine as needed.
